# Patient Record
Sex: Female | Born: 1995 | Race: White | Hispanic: No | Marital: Single | Smoking: Never smoker
Health system: Southern US, Community
[De-identification: ages and names within clinical notes are randomized; demographics above are authoritative.]

## PROBLEM LIST (undated history)

## (undated) DIAGNOSIS — K589 Irritable bowel syndrome without diarrhea: Secondary | ICD-10-CM

## (undated) DIAGNOSIS — Q796 Ehlers-Danlos syndrome, unspecified: Secondary | ICD-10-CM

## (undated) HISTORY — PX: FOOT SURGERY: SHX648

## (undated) HISTORY — PX: TYMPANOSTOMY TUBE PLACEMENT: SHX32

---

## 2015-07-03 ENCOUNTER — Emergency Department (HOSPITAL_BASED_OUTPATIENT_CLINIC_OR_DEPARTMENT_OTHER)
Admission: EM | Admit: 2015-07-03 | Discharge: 2015-07-04 | Disposition: A | Discharge status: Home / Self Care | Payer: BLUE CROSS/BLUE SHIELD | Attending: Emergency Medicine | Admitting: Emergency Medicine

## 2015-07-03 ENCOUNTER — Emergency Department (HOSPITAL_BASED_OUTPATIENT_CLINIC_OR_DEPARTMENT_OTHER): Payer: BLUE CROSS/BLUE SHIELD

## 2015-07-03 ENCOUNTER — Encounter (HOSPITAL_BASED_OUTPATIENT_CLINIC_OR_DEPARTMENT_OTHER): Payer: Self-pay | Admitting: Emergency Medicine

## 2015-07-03 DIAGNOSIS — K580 Irritable bowel syndrome with diarrhea: Secondary | ICD-10-CM | POA: Diagnosis not present

## 2015-07-03 DIAGNOSIS — R109 Unspecified abdominal pain: Secondary | ICD-10-CM | POA: Diagnosis present

## 2015-07-03 DIAGNOSIS — K589 Irritable bowel syndrome without diarrhea: Secondary | ICD-10-CM

## 2015-07-03 DIAGNOSIS — Z3202 Encounter for pregnancy test, result negative: Secondary | ICD-10-CM | POA: Insufficient documentation

## 2015-07-03 DIAGNOSIS — Q796 Ehlers-Danlos syndrome: Secondary | ICD-10-CM | POA: Insufficient documentation

## 2015-07-03 DIAGNOSIS — R197 Diarrhea, unspecified: Secondary | ICD-10-CM

## 2015-07-03 HISTORY — DX: Irritable bowel syndrome without diarrhea: K58.9

## 2015-07-03 HISTORY — DX: Ehlers-Danlos syndrome, unspecified: Q79.60

## 2015-07-03 HISTORY — DX: Irritable bowel syndrome, unspecified: K58.9

## 2015-07-03 LAB — URINALYSIS, ROUTINE W REFLEX MICROSCOPIC
Bilirubin Urine: NEGATIVE
GLUCOSE, UA: NEGATIVE mg/dL
Ketones, ur: NEGATIVE mg/dL
Nitrite: NEGATIVE
Protein, ur: NEGATIVE mg/dL
SPECIFIC GRAVITY, URINE: 1.02 (ref 1.005–1.030)
pH: 7 (ref 5.0–8.0)

## 2015-07-03 LAB — URINE MICROSCOPIC-ADD ON

## 2015-07-03 LAB — PREGNANCY, URINE: PREG TEST UR: NEGATIVE

## 2015-07-03 MED ORDER — ONDANSETRON 8 MG PO TBDP
8.0000 mg | ORAL_TABLET | Freq: Once | ORAL | Status: AC
  Administered 2015-07-04: 8 mg via ORAL
  Filled 2015-07-03: qty 1

## 2015-07-03 MED ORDER — GI COCKTAIL ~~LOC~~
30.0000 mL | Freq: Once | ORAL | Status: AC
  Administered 2015-07-04: 30 mL via ORAL
  Filled 2015-07-03: qty 30

## 2015-07-03 MED ORDER — HYOSCYAMINE SULFATE 0.125 MG SL SUBL
0.1250 mg | SUBLINGUAL_TABLET | Freq: Once | SUBLINGUAL | Status: AC
  Administered 2015-07-04: 0.125 mg via SUBLINGUAL
  Filled 2015-07-03: qty 1

## 2015-07-03 NOTE — ED Notes (Signed)
Pt in c/o lower abd pain onset 2 days ago after barium study. States has been followed by gastroenterologist for previous bleeding. Denies n/v, but diarrhea today.

## 2015-07-03 NOTE — ED Provider Notes (Signed)
CSN: [id number]     Arrival date & time 07/03/15  2220 History  By signing my name below, I, Alexandra Stevens, attest that this documentation has been prepared under the direction and in the presence of Alexandra Klauer, MD. Electronically Signed: Bethel Stevens, ED Scribe. 07/04/2015. 12:33 AM   Chief Complaint  Patient presents with  . Abdominal Pain   Patient is a 20 y.o. female presenting with abdominal pain. The history is provided by the patient. No language interpreter was used.  Abdominal Pain Pain location:  Generalized Pain quality: cramping   Pain radiates to:  Does not radiate Pain severity:  Moderate Onset quality:  Gradual Duration:  3 days Timing:  Constant Progression:  Unchanged Chronicity:  New Context comment:  Barium Relieved by:  Nothing Worsened by:  Nothing tried Ineffective treatments:  OTC medications Associated symptoms: diarrhea   Associated symptoms: no constipation, no fever, no hematochezia, no melena, no nausea and no vomiting   Risk factors: has not had multiple surgeries    Alexandra Stevens is a 20 y.o. female with history of IBS who presents to the Emergency Department complaining of constant, cramping,  4/10 in severity, generalized abdominal pain with onset 3 days ago after drinking barium for an XR. She had the XR done to identify the source of internal bleeding. The week before she had a colonoscopy and endoscopy. OTC pain medication provided insufficient pain relief at home, last dose was yesterday. Associated symptoms include 2 episodes of diarrhea tonight. Pt denies vomiting. She takes nothing for her IBS.   Past Medical History  Diagnosis Date  . IBS (irritable bowel syndrome)   . Ehlers-Danlos disease    Past Surgical History  Procedure Laterality Date  . Foot surgery    . Tympanostomy tube placement     History reviewed. No pertinent family history. Social History  Substance Use Topics  . Smoking status: Never Smoker   . Smokeless  tobacco: None  . Alcohol Use: No   OB History    No data available     Review of Systems  Constitutional: Negative for fever.  Gastrointestinal: Positive for abdominal pain and diarrhea. Negative for nausea, vomiting, constipation, melena and hematochezia.  All other systems reviewed and are negative.   Allergies  Zithromax  Home Medications   Prior to Admission medications   Not on File   BP 135/81 mmHg  Pulse 74  Temp(Src) 98.7 F (37.1 C) (Oral)  Resp 16  Ht  (1.575 m)  Wt 125 lb (56.7 kg)  BMI 22.86 kg/m2  SpO2 100%  LMP 06/30/2015 Physical Exam  Constitutional: She is oriented to person, place, and time. She appears well-developed and well-nourished. No distress.  Well appearing and smiling  HENT:  Head: Normocephalic and atraumatic.  Mouth/Throat: Oropharynx is clear and moist. No oropharyngeal exudate.  Moist mucous membranes No exudate  Eyes: Pupils are equal, round, and reactive to light.  Neck: Normal range of motion.  Trachea midline  Cardiovascular: Normal rate and regular rhythm.   Pulmonary/Chest: Effort normal and breath sounds normal. No stridor. She has no wheezes. She has no rales.  CTAB   Abdominal: Soft. She exhibits no mass. Bowel sounds are increased. There is no tenderness. There is no rigidity, no rebound, no guarding, no tenderness at McBurney's point and negative Murphy's sign.  Musculoskeletal: Normal range of motion. She exhibits no edema or tenderness.  Neurological: She is alert and oriented to person, place, and time. She has normal reflexes.  3+ upper and lower reflexes   Skin: Skin is warm and dry.  Psychiatric: She has a normal mood and affect. Her behavior is normal.  Nursing note and vitals reviewed.   ED Course  Procedures (including critical care time) DIAGNOSTIC STUDIES: Oxygen Saturation is 100% on RA,  normal by my interpretation.    COORDINATION OF CARE: 11:37 PM Discussed treatment plan which includes lab work  and pain medication with pt at bedside and pt agreed to plan.  Labs Review Labs Reviewed  URINALYSIS, ROUTINE W REFLEX MICROSCOPIC (NOT AT [facility]) - Abnormal; Notable for the following:    APPearance CLOUDY (*)    Hgb urine dipstick LARGE (*)    Leukocytes, UA MODERATE (*)    All other components within normal limits  URINE MICROSCOPIC-ADD ON - Abnormal; Notable for the following:    Squamous Epithelial / LPF 0-5 (*)    Bacteria, UA FEW (*)    All other components within normal limits  PREGNANCY, URINE    Imaging Review Dg Abd Acute W/chest  07/04/2015  CLINICAL DATA:  Acute onset of cramping generalized abdominal pain after recent upper GI series. Diarrhea. Initial encounter. EXAM: DG ABDOMEN ACUTE W/ 1V CHEST COMPARISON:  None. FINDINGS: The lungs are well-aerated and clear. There is no evidence of focal opacification, pleural effusion or pneumothorax. The cardiomediastinal silhouette is within normal limits. The visualized bowel gas pattern is unremarkable. Scattered stool and air are seen within the colon; there is no evidence of small bowel dilatation to suggest obstruction. No free intra-abdominal air is identified on the provided upright view. No acute osseous abnormalities are seen; the sacroiliac joints are unremarkable in appearance. IMPRESSION: 1. Unremarkable bowel gas pattern; no free intra-abdominal air seen. Small amount of stool noted in the colon. 2. No acute cardiopulmonary process seen. Electronically Signed   By: Roanna Raider M.D.   On: 07/04/2015 00:19   I personally reviewed and evaluated these images and lab results as a part of my medical decision-making.    EKG Interpretation None      MDM   Final diagnoses:  None      Care Everywhere reviewed, result of barium evaluation normal, no strictures.   Filed Vitals:   07/03/15 2231 07/04/15 0023  BP: 135/81 124/76  Pulse: 74 55  Temp: 98.7 F (37.1 C)   Resp: 16    Results for orders placed or performed  during the hospital encounter of 07/03/15  Urinalysis, Routine w reflex microscopic (not at [facility])  Result Value Ref Range   Color, Urine YELLOW YELLOW   APPearance CLOUDY (A) CLEAR   Specific Gravity, Urine 1.020 1.005 - 1.030   pH 7.0 5.0 - 8.0   Glucose, UA NEGATIVE NEGATIVE mg/dL   Hgb urine dipstick LARGE (A) NEGATIVE   Bilirubin Urine NEGATIVE NEGATIVE   Ketones, ur NEGATIVE NEGATIVE mg/dL   Protein, ur NEGATIVE NEGATIVE mg/dL   Nitrite NEGATIVE NEGATIVE   Leukocytes, UA MODERATE (A) NEGATIVE  Pregnancy, urine  Result Value Ref Range   Preg Test, Ur NEGATIVE NEGATIVE  Urine microscopic-add on  Result Value Ref Range   Squamous Epithelial / LPF 0-5 (A) NONE SEEN   WBC, UA 0-5 0 - 5 WBC/hpf   RBC / HPF 6-30 0 - 5 RBC/hpf   Bacteria, UA FEW (A) NONE SEEN   Dg Abd Acute W/chest  07/04/2015  CLINICAL DATA:  Acute onset of cramping generalized abdominal pain after recent upper GI series. Diarrhea. Initial encounter. EXAM:  DG ABDOMEN ACUTE W/ 1V CHEST COMPARISON:  None. FINDINGS: The lungs are well-aerated and clear. There is no evidence of focal opacification, pleural effusion or pneumothorax. The cardiomediastinal silhouette is within normal limits. The visualized bowel gas pattern is unremarkable. Scattered stool and air are seen within the colon; there is no evidence of small bowel dilatation to suggest obstruction. No free intra-abdominal air is identified on the provided upright view. No acute osseous abnormalities are seen; the sacroiliac joints are unremarkable in appearance. IMPRESSION: 1. Unremarkable bowel gas pattern; no free intra-abdominal air seen. Small amount of stool noted in the colon. 2. No acute cardiopulmonary process seen. Electronically Signed   By: Roanna RaiderJeffery  Chang M.D.   On: 07/04/2015 00:19   Medications  hyoscyamine (LEVSIN SL) SL tablet 0.125 mg (0.125 mg Sublingual Given 07/04/15 0019)  ondansetron (ZOFRAN-ODT) disintegrating tablet 8 mg (8 mg Oral Given 07/04/15  0019)  gi cocktail (Maalox,Lidocaine,Donnatal) (30 mLs Oral Given 07/04/15 0019)    Exam and vitals are benign and reassuring.  No signs of a surgical abdomen.  Has had EGD colonoscopy and UGI study within the past several weeks all negative.  Urine is contaminated (patient also on menses) culture sent.  Pain is better post medication.  Suspect IBS flare.  Will treat with levsin and bland diet.  Strict return precautions given patient to follow up with PMD this week for recheck  I personally performed the services described in this documentation, which was scribed in my presence. The recorded information has been reviewed and is accurate.     Cy BlamerApril Skyleigh Windle, MD 07/04/15 0120

## 2015-07-04 ENCOUNTER — Encounter (HOSPITAL_BASED_OUTPATIENT_CLINIC_OR_DEPARTMENT_OTHER): Payer: Self-pay | Admitting: Emergency Medicine

## 2015-07-04 MED ORDER — HYOSCYAMINE SULFATE 0.125 MG SL SUBL: 0.1250 mg | SUBLINGUAL_TABLET | SUBLINGUAL | Status: AC | PRN

## 2015-07-04 NOTE — ED Notes (Signed)
Pt alert, NAD, calm, interactive, resps e/u, speaking in clear complete sentences, skin W&D, VSS.  C/o abd pain, rates 4/10, also nausea and diarrhea, last BM 1845, last ate 1800, (denies: fever, vomiting or other sx), denies urinary sx, "currently on her period". [facility]

## 2015-07-06 LAB — URINE CULTURE: SPECIAL REQUESTS: NORMAL

## 2015-07-07 ENCOUNTER — Telehealth (HOSPITAL_BASED_OUTPATIENT_CLINIC_OR_DEPARTMENT_OTHER): Payer: Self-pay | Admitting: Emergency Medicine

## 2015-07-07 NOTE — Progress Notes (Signed)
ED Antimicrobial Stewardship Positive Culture Follow Up   Alexandra Stevens is an 20 y.o. female who [facility] with a chief complaint of  Chief Complaint  Patient presents with  . Abdominal Pain    Recent Results (from the past 720 hour(s))  Urine culture     Status: Abnormal   Collection Time: 07/03/15 10:40 PM  Result Value Ref Range Status   Specimen Description URINE, CATHETERIZED  Final   Special Requests Normal  Final   Culture (A)  Final    >=100,000 COLONIES/mL ESCHERICHIA COLI 50,000 COLONIES/mL VIRIDANS STREPTOCOCCUS    Report Status 07/06/2015 FINAL  Final   Organism ID, Bacteria ESCHERICHIA COLI (A)  Final      Susceptibility   Escherichia coli - MIC*    AMPICILLIN <=2 SENSITIVE Sensitive     CEFAZOLIN <=4 SENSITIVE Sensitive     CEFTRIAXONE <=1 SENSITIVE Sensitive     CIPROFLOXACIN <=0.25 SENSITIVE Sensitive     GENTAMICIN <=1 SENSITIVE Sensitive     IMIPENEM <=0.25 SENSITIVE Sensitive     NITROFURANTOIN <=16 SENSITIVE Sensitive     TRIMETH/SULFA <=20 SENSITIVE Sensitive     AMPICILLIN/SULBACTAM <=2 SENSITIVE Sensitive     PIP/TAZO <=4 SENSITIVE Sensitive     * >=100,000 COLONIES/mL ESCHERICHIA COLI    20 yof presenting with lower abd pain x 2 days. S/P EGD and colonoscopy. No urinary symptoms and negative urinalysis. No treatment indicated at this time.   ED Provider: Zadie Rhineonald Wickline, MD   [name] C. Marvis MoellerMiles, PharmD Pharmacy Resident  Pager: [phone] 07/07/2015 10:40 AM

## 2015-07-07 NOTE — Telephone Encounter (Signed)
Post ED Visit - Positive Culture Follow-up  Culture report reviewed by antimicrobial stewardship pharmacist:  []  Alexandra Stevens, Pharm.D. []  Alexandra Stevens, Pharm.D., BCPS []  Alexandra Stevens, Pharm.D. []  Alexandra Stevens, Pharm.D., BCPS []  Alexandra Stevens, Alexandra Stevens., BCPS, AAHIVP []  Alexandra Stevens, Pharm.D., BCPS, AAHIVP []  Alexandra Stevens, Pharm.D. []  Alexandra Stevens, Alexandra Stevens Alexandra Stevens  Positive urine culture Treated with none, asymptomatic, organism sensitive to the same and no further patient follow-up is required at this time.  Alexandra Stevens, Alexandra Stevens 07/07/2015, 4:23 PM

## 2017-02-10 IMAGING — DX DG ABDOMEN ACUTE W/ 1V CHEST
3 series · 3 of 3 positions shown · non-contrast
Comparison: None.

CLINICAL DATA: Acute onset of cramping generalized abdominal pain
after recent upper GI series. Diarrhea. Initial encounter.

EXAM:
DG ABDOMEN ACUTE W/ 1V CHEST

[chest pa]
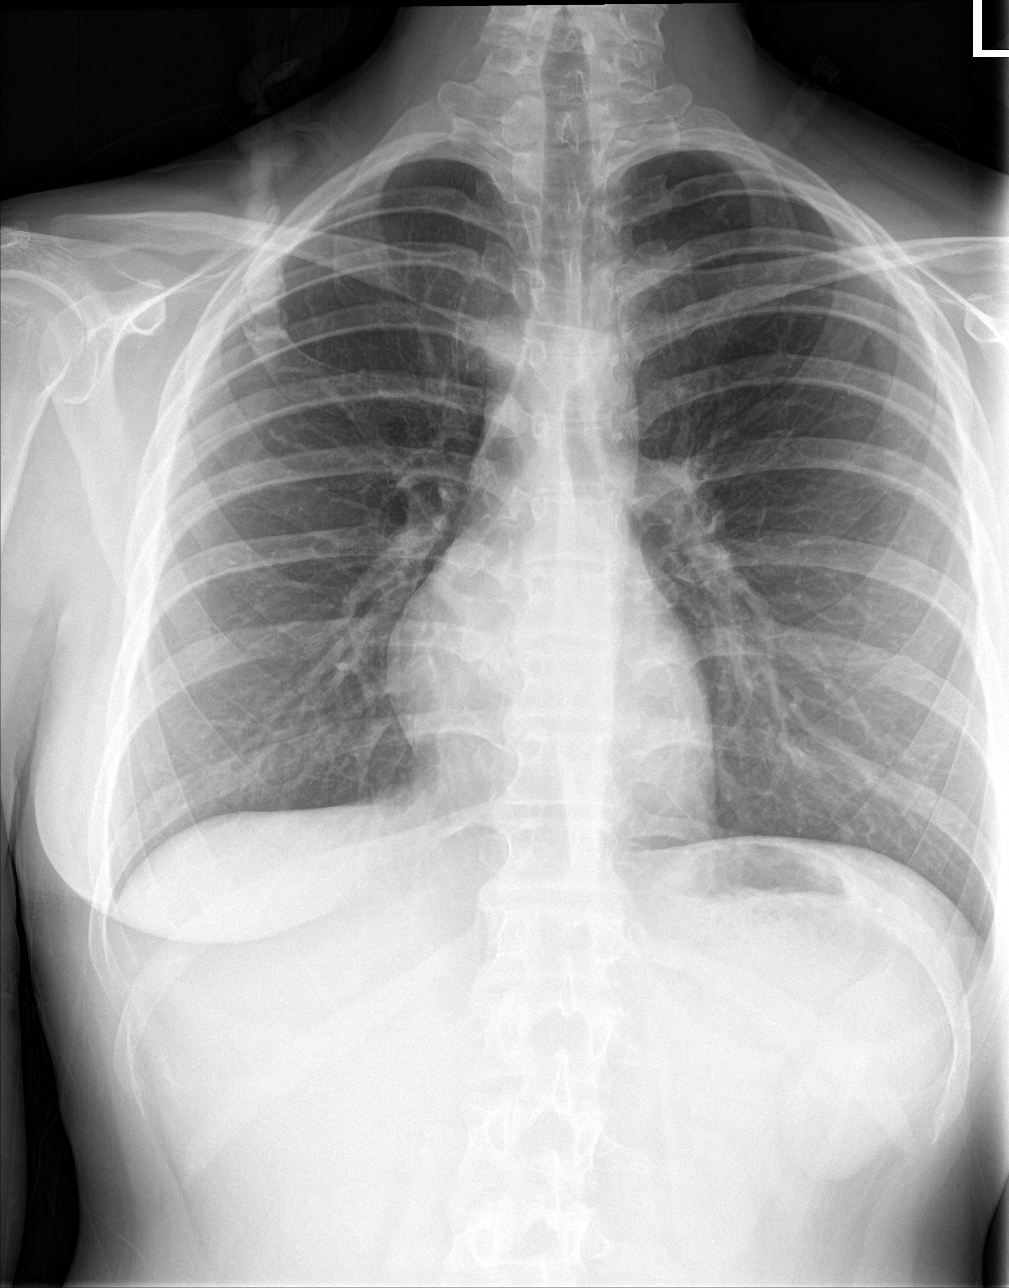

[abdomen erect]
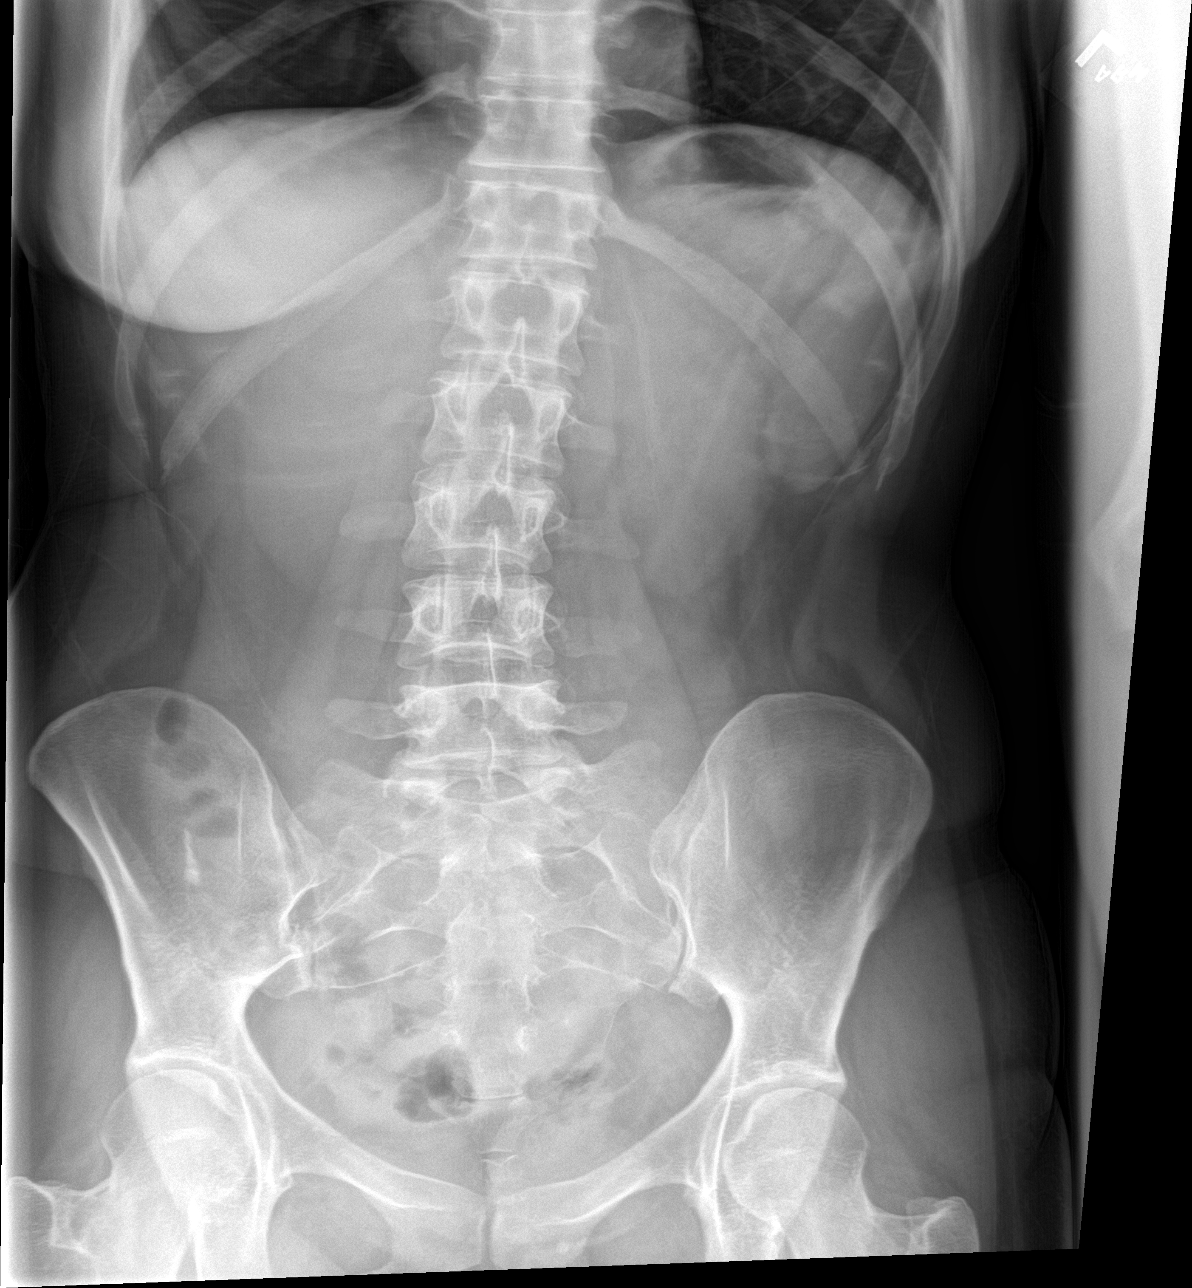

[abdomen supine]
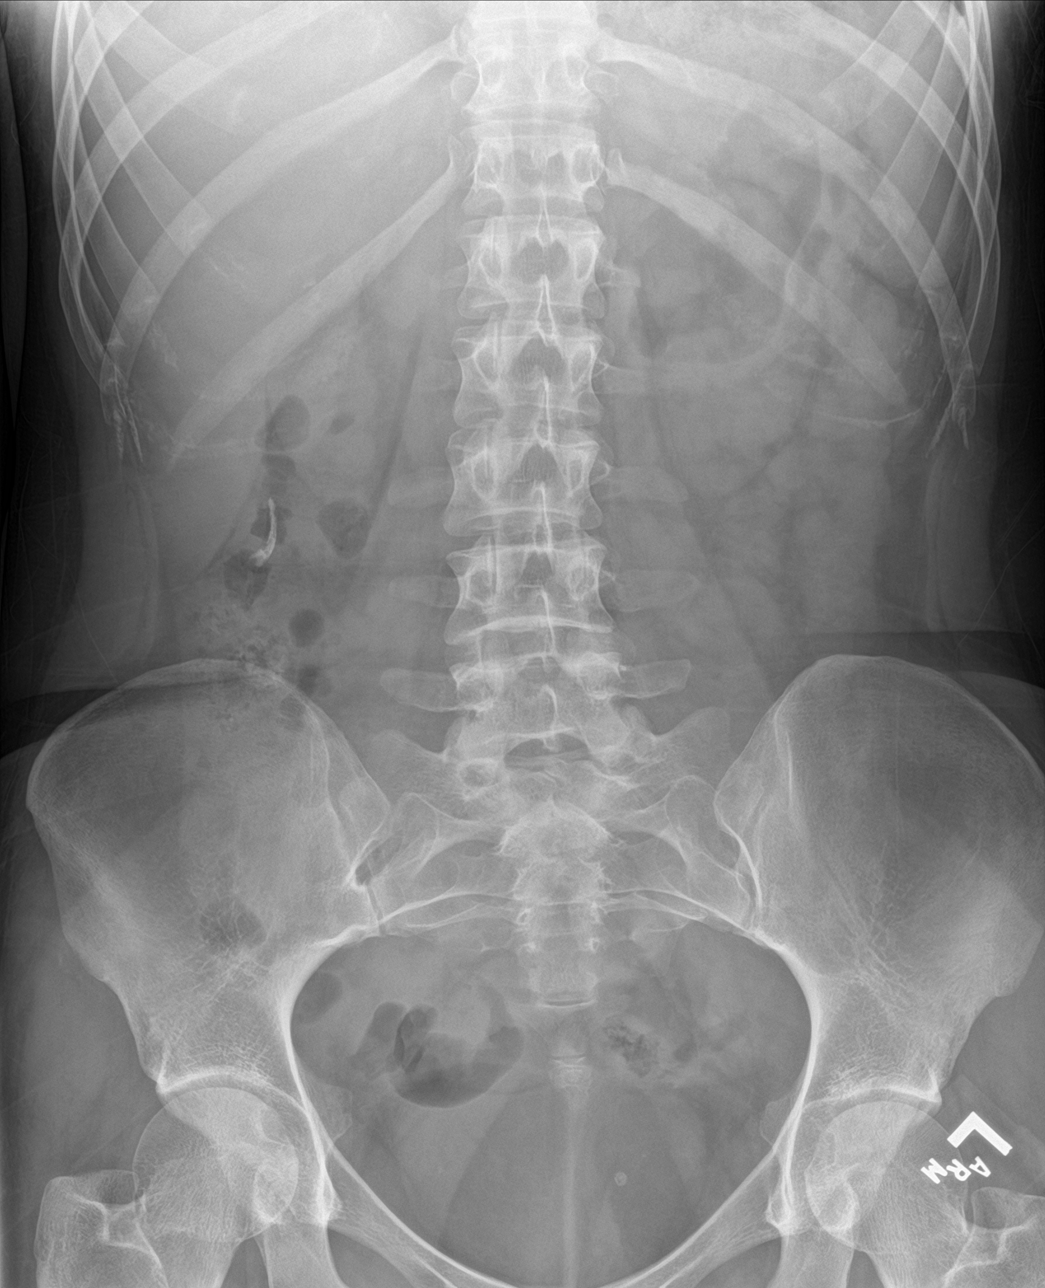

[3 of 3 positions shown; findings below may reference images not displayed]

FINDINGS: The lungs are well-aerated and clear. There is no evidence of focal
opacification, pleural effusion or pneumothorax. The
cardiomediastinal silhouette is within normal limits.

The visualized bowel gas pattern is unremarkable. Scattered stool
and air are seen within the colon; there is no evidence of small
bowel dilatation to suggest obstruction. No free intra-abdominal air
is identified on the provided upright view.

No acute osseous abnormalities are seen; the sacroiliac joints are
unremarkable in appearance.
IMPRESSION: 1. Unremarkable bowel gas pattern; no free intra-abdominal air seen.
Small amount of stool noted in the colon.
2. No acute cardiopulmonary process seen.
